# Patient Record
Sex: Male | Born: 1995 | Race: Black or African American | Hispanic: No | Marital: Single | Smoking: Current every day smoker
Health system: Southern US, Community
[De-identification: ages and names within clinical notes are randomized; demographics above are authoritative.]

---

## 2016-09-01 ENCOUNTER — Encounter: Payer: Self-pay | Admitting: *Deleted

## 2016-09-01 ENCOUNTER — Ambulatory Visit
Admission: EM | Admit: 2016-09-01 | Discharge: 2016-09-01 | Disposition: A | Discharge status: Home / Self Care | Payer: Self-pay | Attending: Family Medicine | Admitting: Family Medicine

## 2016-09-01 DIAGNOSIS — L02214 Cutaneous abscess of groin: Secondary | ICD-10-CM

## 2016-09-01 MED ORDER — SULFAMETHOXAZOLE-TRIMETHOPRIM 800-160 MG PO TABS: 1.0000 | ORAL_TABLET | Freq: Two times a day (BID) | ORAL | 0 refills | Status: AC

## 2016-09-01 MED ORDER — MUPIROCIN 2 % EX OINT: TOPICAL_OINTMENT | CUTANEOUS | 0 refills | Status: DC

## 2016-09-01 NOTE — ED Triage Notes (Signed)
Abcess to right groin, x1 week. Now draining.

## 2016-09-01 NOTE — Discharge Instructions (Signed)
Take medication as prescribed. Rest. Drink plenty of fluids. Keep area clean. Apply warm compresses as discussed.   Follow up with your primary care physician this week as needed. Return to Urgent care for new or worsening concerns.

## 2016-09-01 NOTE — ED Provider Notes (Signed)
[facility] ____________________________________________  Time seen: Approximately 11:38 AM  I have reviewed the triage vital signs and the nursing notes.   HISTORY  Chief Complaint Abscess  HPI  Hunter Wong is a 21 y.o. male pain for evaluation of abscess to right groin. Patient reports that this is been a gradual onset over the last week and started draining over the last 2 days. Patient reports drinking increased today. Reports intermittent pain to the same area and states mostly at night when he is trying to changes positions causes aggravation. Patient states mild pain to the same area at this time. Denies history of similar in past. Reports that his girlfriend does have a history of some abscesses, denies known history of MRSA. Denies any other skin changes, rash or swelling. Denies any penile or testicular pain, swelling or discharge. Patient reports that the area started as a small bump or ingrown hair and then progressed. Patient denies fevers. Reports continues to eat and drink well.   Denies chest pain, shortness of breath, abdominal pain, dysuria, extremity pain, extremity swelling or rash. Denies recent sickness. Denies recent antibiotic use.    History reviewed. No pertinent past medical history. Denies chronic medical issues.  There are no active problems to display for this patient.   History reviewed. No pertinent surgical history.   No current facility-administered medications for this encounter.   Current Outpatient Prescriptions:  .  mupirocin ointment (BACTROBAN) 2 %, Apply three times a day for 7 days., Disp: 22 g, Rfl: 0 .  sulfamethoxazole-trimethoprim (BACTRIM DS,SEPTRA DS) 800-160 MG tablet, Take 1 tablet by mouth 2 (two) times daily., Disp: 20 tablet, Rfl: 0  Allergies Patient has no known allergies.  family history Denies family history of MRSA or abscesses.   Social History Social History  Substance Use Topics  . Smoking status:  Current Every Day Smoker  . Smokeless tobacco: Never Used  . Alcohol use No    Review of Systems Constitutional: No fever/chills Cardiovascular: Denies chest pain. Respiratory: Denies shortness of breath. Gastrointestinal: No abdominal pain.  No nausea, no vomiting.  No diarrhea.  Genitourinary: Negative for dysuria. Musculoskeletal: Negative for back pain. Skin: As above.   ____________________________________________   PHYSICAL EXAM:  VITAL SIGNS: ED Triage Vitals  Enc Vitals Group     BP 09/01/16 1007 124/80     Pulse Rate 09/01/16 1007 (!) 55     Resp 09/01/16 1007 16     Temp 09/01/16 1007 98.7 F (37.1 C)     Temp Source 09/01/16 1007 Oral     SpO2 09/01/16 1007 100 %     Weight 09/01/16 1009 195 lb (88.5 kg)     Height 09/01/16 1009  (1.753 m)     Head Circumference --      Peak Flow --      Pain Score 09/01/16 1105 7     Pain Loc --      Pain Edu? --      Excl. in GC? --     Constitutional: Alert and oriented. Well appearing and in no acute distress. ENT      Head: Normocephalic and atraumatic. Cardiovascular: Normal rate, regular rhythm. Grossly normal heart sounds.  Good peripheral circulation. Respiratory: Normal respiratory effort without tachypnea nor retractions. Breath sounds are clear and equal bilaterally. No wheezes, rales, rhonchi. Gastrointestinal: Soft and nontender. No distention.  Musculoskeletal:  Ambulatory with steady gait.  Neurologic:  Normal speech and language. Speech is normal. No gait  instability.  Skin:  Skin is warm, dry.  Except: Exam completed with Selena Batten RN at bedside as chaperone: Approximately 2 x 2 cm area of erythema induration present to right upper inguinal crease and mild fluctuance with active purulent drainage, no further surrounding induration or erythema, mild tenderness to direct palpation, no surrounding tenderness, bilateral lower extremities and full range of motion. Patient ambulatory in room. Psychiatric: Mood  and affect are normal. Speech and behavior are normal. Patient exhibits appropriate insight and judgment.   ___________________________________________   LABS (all labs ordered are listed, but only abnormal results are displayed)  Labs Reviewed  AEROBIC/ANAEROBIC CULTURE (SURGICAL/DEEP WOUND)     PROCEDURES Procedures    INITIAL IMPRESSION / ASSESSMENT AND PLAN / ED COURSE  Pertinent labs & imaging results that were available during my care of the patient were reviewed by me and considered in my medical decision making (see chart for details).  Well-appearing patient. No acute distress. Right inguinal crease abscess actively draining. Encouraged patient to keep clean, warm compresses and will treat with oral Bactrim and topical Bactroban. Wound culture obtained. Encouraged supportive care. Discussed strict follow-up and return parameters. Discussed indication, risks and benefits of medications with patient.  Discussed follow up with Primary care physician this week. Discussed follow up and return parameters including no resolution or any worsening concerns. Patient verbalized understanding and agreed to plan.   ____________________________________________   FINAL CLINICAL IMPRESSION(S) / ED DIAGNOSES  Final diagnoses:  Abscess of right groin     Discharge Medication List as of 09/01/2016 11:03 AM    START taking these medications   Details  mupirocin ointment (BACTROBAN) 2 % Apply three times a day for 7 days., Normal    sulfamethoxazole-trimethoprim (BACTRIM DS,SEPTRA DS) 800-160 MG tablet Take 1 tablet by mouth 2 (two) times daily., Starting Wed 09/01/2016, Until Sat 09/11/2016, Normal        Note: This dictation was prepared with Dragon dictation along with smaller phrase technology. Any transcriptional errors that result from this process are unintentional.         Renford Dills, NP 09/01/16 1405

## 2016-09-06 LAB — AEROBIC/ANAEROBIC CULTURE (SURGICAL/DEEP WOUND)

## 2016-09-06 LAB — AEROBIC/ANAEROBIC CULTURE W GRAM STAIN (SURGICAL/DEEP WOUND): Special Requests: NORMAL

## 2019-08-30 ENCOUNTER — Ambulatory Visit
Admission: EM | Admit: 2019-08-30 | Discharge: 2019-08-30 | Disposition: A | Discharge status: Home / Self Care | Payer: Self-pay | Attending: Family Medicine | Admitting: Family Medicine

## 2019-08-30 ENCOUNTER — Encounter: Payer: Self-pay | Admitting: Emergency Medicine

## 2019-08-30 ENCOUNTER — Other Ambulatory Visit: Payer: Self-pay

## 2019-08-30 DIAGNOSIS — L0291 Cutaneous abscess, unspecified: Secondary | ICD-10-CM

## 2019-08-30 MED ORDER — KETOROLAC TROMETHAMINE 10 MG PO TABS: 10.0000 mg | ORAL_TABLET | Freq: Four times a day (QID) | ORAL | 0 refills | Status: DC | PRN

## 2019-08-30 MED ORDER — DOXYCYCLINE HYCLATE 100 MG PO CAPS: 100.0000 mg | ORAL_CAPSULE | Freq: Two times a day (BID) | ORAL | 0 refills | Status: DC

## 2019-08-30 NOTE — ED Triage Notes (Signed)
Patient c/o abscess to left lower back x 3-4 days. He states the area is very painful to touch.

## 2019-08-30 NOTE — Discharge Instructions (Signed)
Return in 2 days for packing removal and reassessment.  Antibiotic twice daily with food.  Take care  Dr. Adriana Simas
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
## 2019-09-01 ENCOUNTER — Other Ambulatory Visit: Payer: Self-pay

## 2019-09-01 ENCOUNTER — Ambulatory Visit
Admission: EM | Admit: 2019-09-01 | Discharge: 2019-09-01 | Disposition: A | Discharge status: Home / Self Care | Payer: Medicaid Other

## 2019-09-01 DIAGNOSIS — L0231 Cutaneous abscess of buttock: Secondary | ICD-10-CM

## 2019-09-01 DIAGNOSIS — L03317 Cellulitis of buttock: Secondary | ICD-10-CM

## 2019-09-01 NOTE — Discharge Instructions (Addendum)
Please change bandage daily.  You may shower and get wet.  Follow-up in 2 to 3 days for recheck and packing removal.  Please use coupon to purchase antibiotics.  Return to the clinic for any increasing pain swelling fevers

## 2019-09-01 NOTE — ED Provider Notes (Signed)
[facility]    CSN: [id number] Arrival date & time: 09/01/19  0808      History   Chief Complaint No chief complaint on file.   HPI Hunter Wong is a 24 y.o. male presents to the urgent care facility for evaluation of left buttocks abscess.  Abscess developed 5 to 6 days ago.  2 days ago received I&D and packing and was placed on antibiotics.  He did not purchase antibiotics.  He comes in today for recheck.  Still having some purulent drainage.  No fevers.  Pain is moderate.  Swelling overall has improved.  HPI  History reviewed. No pertinent past medical history.  There are no problems to display for this patient.   History reviewed. No pertinent surgical history.     Home Medications    Prior to Admission medications   Medication Sig Start Date End Date Taking? Authorizing Provider  doxycycline (VIBRAMYCIN) 100 MG capsule Take 1 capsule (100 mg total) by mouth 2 (two) times daily. Patient not taking: Reported on 09/01/2019 08/30/19   Tommie Sams, DO  ketorolac (TORADOL) 10 MG tablet Take 1 tablet (10 mg total) by mouth every 6 (six) hours as needed for moderate pain or severe pain. Patient not taking: Reported on 09/01/2019 08/30/19   Tommie Sams, DO    Family History History reviewed. No pertinent family history.  Social History Social History   Tobacco Use  . Smoking status: Former Games developer  . Smokeless tobacco: Never Used  Substance Use Topics  . Alcohol use: No  . Drug use: Never     Allergies   Patient has no known allergies.   Review of Systems Review of Systems  Constitutional: Negative for fatigue and fever.  Cardiovascular: Negative for leg swelling.  Musculoskeletal: Negative for arthralgias and back pain.  Skin: Positive for wound.  Neurological: Negative for numbness.     Physical Exam Triage Vital Signs ED Triage Vitals  Enc Vitals Group     BP 09/01/19 0823 (!) 140/92     Pulse Rate 09/01/19 0823 75     Resp 09/01/19  0823 18     Temp 09/01/19 0823 98 F (36.7 C)     Temp Source 09/01/19 0823 Oral     SpO2 09/01/19 0823 100 %     Weight 09/01/19 0821 167 lb (75.8 kg)     Height 09/01/19 0821 5\' 9"  (1.753 m)     Head Circumference --      Peak Flow --      Pain Score 09/01/19 0821 10     Pain Loc --      Pain Edu? --      Excl. in GC? --    No data found.  Updated Vital Signs BP (!) 140/92 (BP Location: Left Arm)   Pulse 75   Temp 98 F (36.7 C) (Oral)   Resp 18   Ht 5\' 9"  (1.753 m)   Wt 167 lb (75.8 kg)   SpO2 100%   BMI 24.66 kg/m   Visual Acuity Right Eye Distance:   Left Eye Distance:   Bilateral Distance:    Right Eye Near:   Left Eye Near:    Bilateral Near:     Physical Exam Constitutional:      Appearance: He is well-developed.  HENT:     Head: Normocephalic and atraumatic.  Eyes:     Conjunctiva/sclera: Conjunctivae normal.  Cardiovascular:     Rate and Rhythm: Normal rate.  Pulmonary:     Effort: Pulmonary effort is normal. No respiratory distress.  Musculoskeletal:        General: Normal range of motion.     Cervical back: Normal range of motion.  Skin:    General: Skin is warm.     Findings: No rash.     Comments: Left superior buttocks, superior small abscess present, 2 cm diameter.  Slight fluctuance.  Iodoform packing is not present, slight fluctuance noted, purulent material expressed with compression.  Abscess cleansed and repacked with iodoform packing.  No active bleeding.  Neurological:     Mental Status: He is alert and oriented to person, place, and time.  Psychiatric:        Behavior: Behavior normal.        Thought Content: Thought content normal.      UC Treatments / Results  Labs (all labs ordered are listed, but only abnormal results are displayed) Labs Reviewed - No data to display  EKG   Radiology No results found.  Procedures Procedures (including critical care time)  Medications Ordered in UC Medications - No data to  display  Initial Impression / Assessment and Plan / UC Course  I have reviewed the triage vital signs and the nursing notes.  Pertinent labs & imaging results that were available during my care of the patient were reviewed by me and considered in my medical decision making (see chart for details).     24 year old male with left buttocks abscess.  Iodoform packing reapplied, he will practice antibiotics.  He is given a coupon today.  He is agreeable to purchasing antibiotics.  Follow-up 2 to 3 days for recheck and packing removal.  He is educated on wound care.  He understands signs symptoms return to the urgent care for. Final Clinical Impressions(s) / UC Diagnoses   Final diagnoses:  Cellulitis and abscess of buttock     Discharge Instructions     Please change bandage daily.  You may shower and get wet.  Follow-up in 2 to 3 days for recheck and packing removal.  Please use coupon to purchase antibiotics.  Return to the clinic for any increasing pain swelling fevers   ED Prescriptions    None     PDMP not reviewed this encounter.   Duanne Guess, PA-C 09/01/19 0900

## 2019-09-01 NOTE — ED Triage Notes (Addendum)
Pt presents with c/o continued pain to area of abscess on left buttock. Pt states the pain is still present. Pt has difficulty sitting and sleeping. Pt states area is still seeping. Pt DID NOT take abx or pain meds rx'd at [facility]. Pt does not have insurance.

## 2020-05-10 ENCOUNTER — Emergency Department
Admission: EM | Admit: 2020-05-10 | Discharge: 2020-05-10 | Disposition: A | Discharge status: Home / Self Care | Payer: Medicaid Other | Attending: Emergency Medicine | Admitting: Emergency Medicine

## 2020-05-10 ENCOUNTER — Other Ambulatory Visit: Payer: Self-pay

## 2020-05-10 DIAGNOSIS — L0201 Cutaneous abscess of face: Secondary | ICD-10-CM | POA: Insufficient documentation

## 2020-05-10 DIAGNOSIS — Z5321 Procedure and treatment not carried out due to patient leaving prior to being seen by health care provider: Secondary | ICD-10-CM | POA: Insufficient documentation

## 2020-05-10 NOTE — ED Notes (Signed)
Calling for pt in lobby, pt not in lobby.

## 2020-05-10 NOTE — ED Notes (Signed)
Called pt a third time without response.

## 2020-05-10 NOTE — ED Notes (Signed)
Pt not visualized in lobby. Called pt again without answer.

## 2020-05-10 NOTE — ED Triage Notes (Signed)
Pt with large golf ball sized abscess noted to right lower chin. Pt states was seen at [facility] for same two days ago, and was prescribed antibiotics but did not get them filled.

## 2020-12-03 ENCOUNTER — Encounter: Payer: Self-pay | Admitting: *Deleted

## 2020-12-03 ENCOUNTER — Other Ambulatory Visit: Payer: Self-pay

## 2020-12-03 ENCOUNTER — Emergency Department
Admission: EM | Admit: 2020-12-03 | Discharge: 2020-12-04 | Disposition: A | Discharge status: Home / Self Care | Payer: Self-pay | Attending: Emergency Medicine | Admitting: Emergency Medicine

## 2020-12-03 DIAGNOSIS — J019 Acute sinusitis, unspecified: Secondary | ICD-10-CM

## 2020-12-03 DIAGNOSIS — Z87891 Personal history of nicotine dependence: Secondary | ICD-10-CM | POA: Insufficient documentation

## 2020-12-03 DIAGNOSIS — H5712 Ocular pain, left eye: Secondary | ICD-10-CM | POA: Insufficient documentation

## 2020-12-03 DIAGNOSIS — R519 Headache, unspecified: Secondary | ICD-10-CM | POA: Insufficient documentation

## 2020-12-03 MED ORDER — FLUORESCEIN SODIUM 1 MG OP STRP
1.0000 | ORAL_STRIP | Freq: Once | OPHTHALMIC | Status: AC
  Administered 2020-12-04: 1 via OPHTHALMIC
  Filled 2020-12-03: qty 1

## 2020-12-03 MED ORDER — TETRACAINE HCL 0.5 % OP SOLN
2.0000 [drp] | Freq: Once | OPHTHALMIC | Status: AC
  Administered 2020-12-04: 2 [drp] via OPHTHALMIC
  Filled 2020-12-03: qty 4

## 2020-12-03 NOTE — ED Triage Notes (Signed)
Pt reports 2-3 days of headache and left eye redness/irritation and watering. Denies n/v. Taking tylenol for pain relief, last took about 1 hour ago.

## 2020-12-03 NOTE — ED Notes (Signed)
Pt reports 10/10 left-sided headache x 3 days with no cough, fever, body aches, nausea, vomiting, runny nose. He does have watery discharge from the left eye for the same 3 day period; he says there is a throbbing pain in the eye rated 10/10 as well. He has taken tylenol with no relief of symptoms. Last dose (1000mg ) was around 9pm tonight.

## 2020-12-04 ENCOUNTER — Emergency Department: Payer: Self-pay

## 2020-12-04 IMAGING — MR MR MRA HEAD WO/W CM
11 of 12 series · 46 of 48 positions shown · IV contrast (7.5ml Gadavist)
Comparison: None.
COMPARISON: None.

Addendum:
CLINICAL DATA: Headache.  Papilledema.

EXAM:
MRI HEAD WITHOUT CONTRAST
MRA HEAD WITHOUT AND WITH CONTRAST
MRV HEAD WITHOUT AND WITH CONTRAST
TECHNIQUE: Multiplanar, multi-echo pulse sequences of the brain and surrounding
structures were acquired without intravenous contrast. Angiographic
images of the Circle of Willis were acquired using MRA technique
without and with intravenous contrast. Angiographic images of the
intracranial venous structures were acquired using MRV technique
without and with intravenous contrast.
CONTRAST:  7.5mL GADAVIST GADOBUTROL 1 MMOL/ML IV SOLN

[Series 5: ax dwi_tracew · axial · 3.0mm · 0.65mm/px · z∈[-100,+61]mm · 5 of 100 slices shown]
[im 1/100]
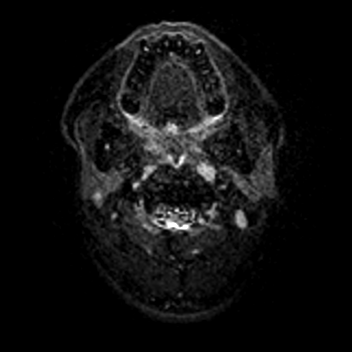
[im 25/100]
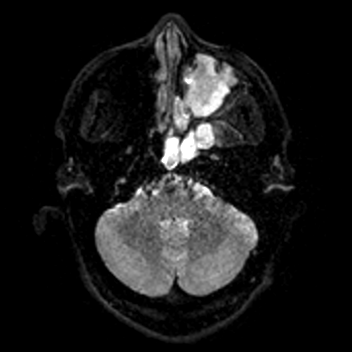
[im 50/100]
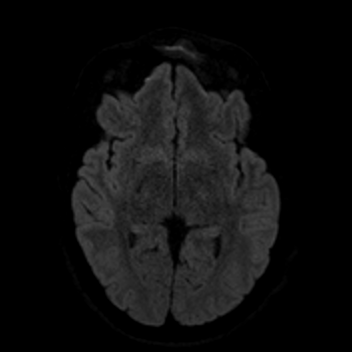
[im 75/100]
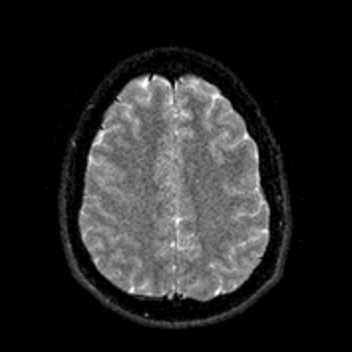
[im 100/100]
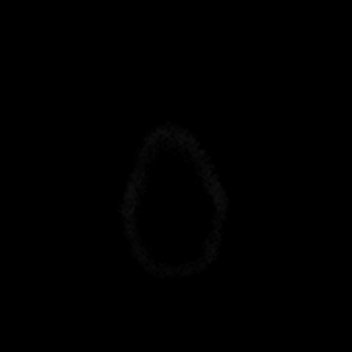

[Series 15: T2 · axial · 5.0mm · 0.53mm/px · 1 of 28 slices shown (1 of 2)]
[im 1/28]
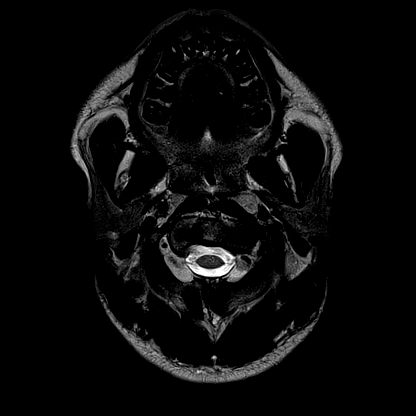

[Series 16: mag_images · axial · 3.0mm · 0.90mm/px · z∈[-109,+67]mm · 3 of 60 slices shown]
[im 1/60]
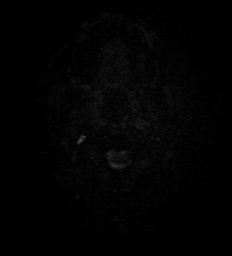
[im 30/60]
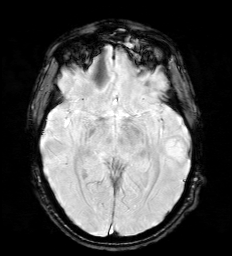
[im 60/60]
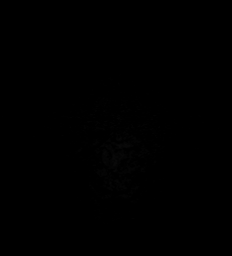

[Series 18: swi_images · axial · 3.0mm · 0.90mm/px · z∈[-109,+67]mm · 3 of 60 slices shown]
[im 1/60]
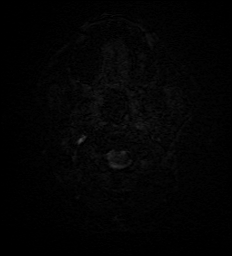
[im 30/60]
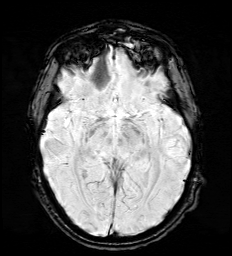
[im 60/60]
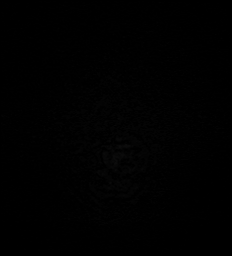

[Series 19: mip_images(sw) · axial · 24.0mm · 0.90mm/px · z∈[-98,+57]mm · 2 of 53 slices shown]
[im 1/53]
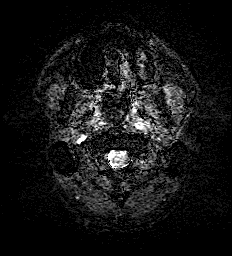
[im 53/53]
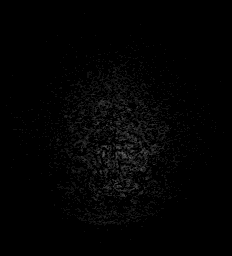

[Series 20: FLAIR · axial · 3.0mm · 0.53mm/px · z∈[-101,+60]mm · 2 of 55 slices shown]
[im 1/55]
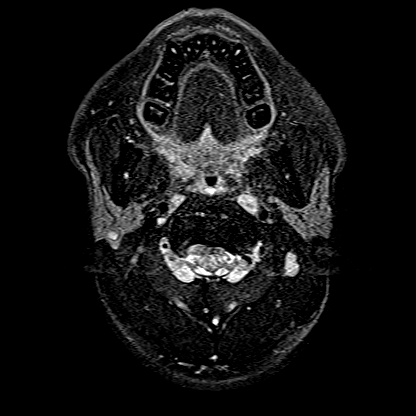
[im 55/55]
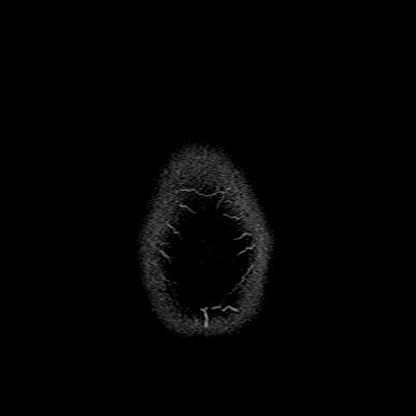

[Series 21: T1 · axial · 1.0mm · 0.98mm/px · z∈[-114,+75]mm · 8 of 192 slices shown]
[im 1/192]
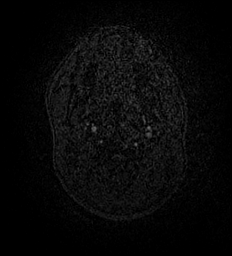
[im 28/192]
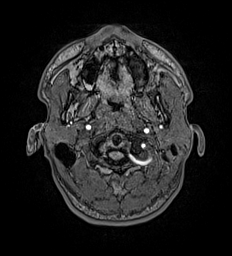
[im 55/192]
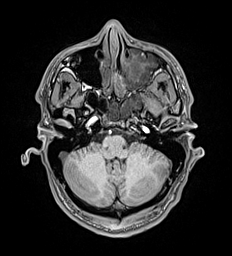
[im 82/192]
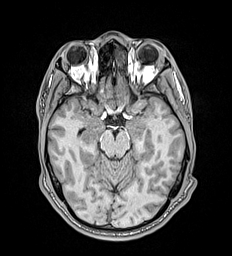
[im 110/192]
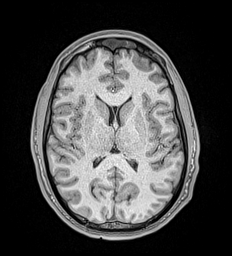
[im 137/192]
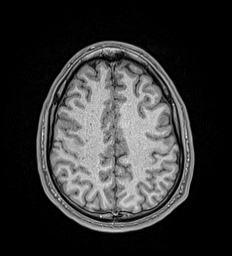
[im 164/192]
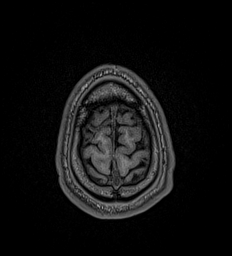
[im 192/192]
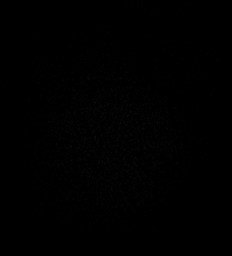

[Series 22: T2 · coronal · 5.0mm · 0.57mm/px · 1 of 35 slices shown (2 of 2)]
[im 1/35]
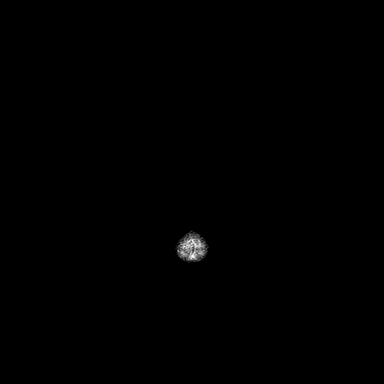

[Series 34: TOF · coronal · 2.5mm · 0.98mm/px · 5 of 128 slices shown]
[im 1/128]
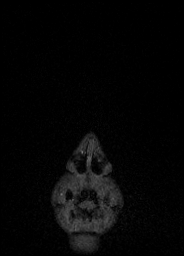
[im 32/128]
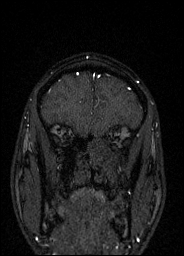
[im 64/128]
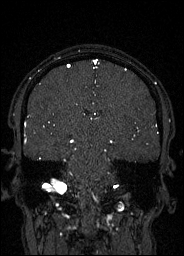
[im 96/128]
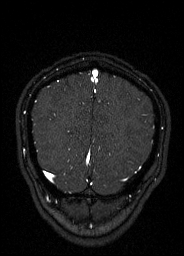
[im 128/128]
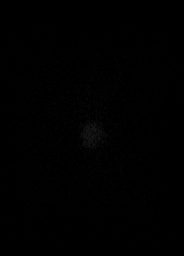

[Series 38: T1 post-contrast · sagittal · 1.0mm · 0.98mm/px · 8 of 191 slices shown (1 of 2)]
[im 1/191]
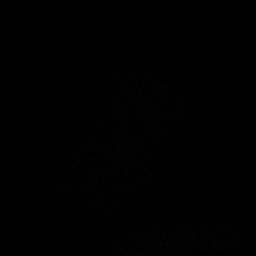
[im 28/191]
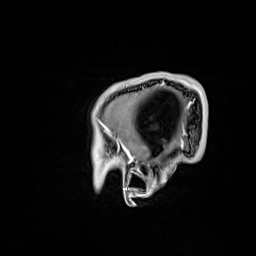
[im 55/191]
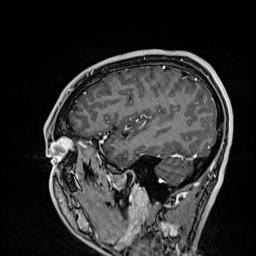
[im 82/191]
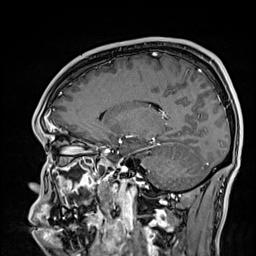
[im 109/191]
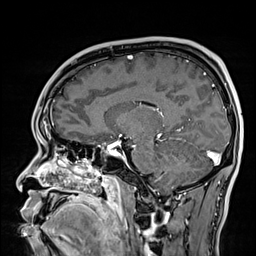
[im 136/191]
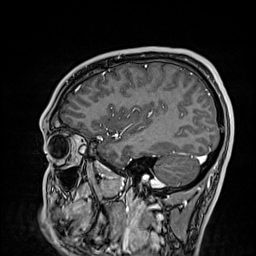
[im 163/191]
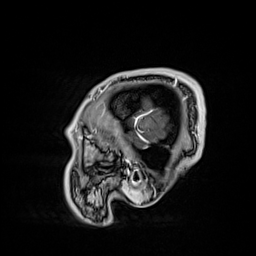
[im 191/191]
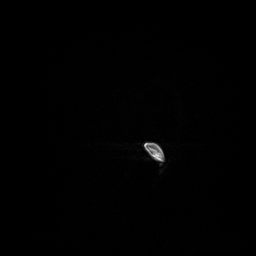

[Series 39: T1 post-contrast · sagittal · 1.0mm · 0.98mm/px · 8 of 192 slices shown (2 of 2)]
[im 1/192]
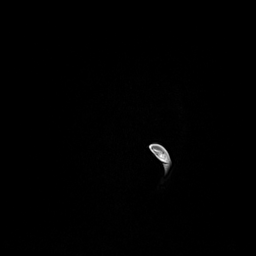
[im 28/192]
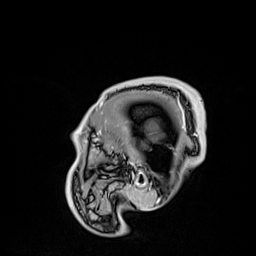
[im 55/192]
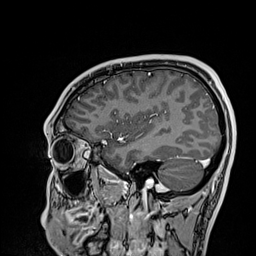
[im 82/192]
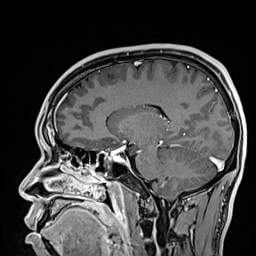
[im 110/192]
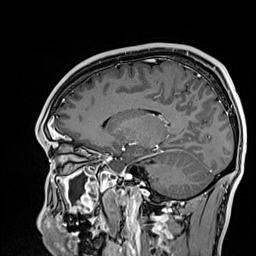
[im 137/192]
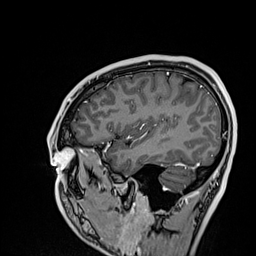
[im 164/192]
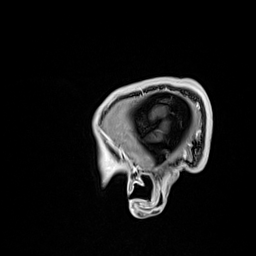
[im 192/192]
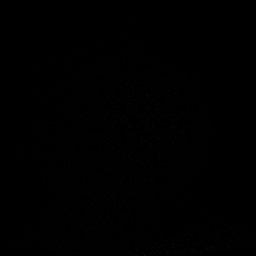

[46 of 48 positions shown; findings below may reference images not displayed]

FINDINGS: MRI HEAD FINDINGS

Brain: No acute infarction, hemorrhage, hydrocephalus, extra-axial
collection or mass lesion. A single punctate T2/FLAIR hyperintensity
is noted within the right frontal white matter, which is nonspecific
but within normal limits for patient age.

Vascular: See below.

Skull and upper cervical spine: Normal marrow signal.

Sinuses/Orbits: Complete opacification of the left frontal sinus,
left ethmoid air cells, left maxillary sinus, and left sphenoid
sinus with marked mucosal thickening in these areas. Mild mucosal
thickening of the right ethmoid air cells and right sphenoid sinus.
Areas of internal T2 hypointensity within the left ethmoid air cells
and the left maxillary sinus.

Other: Enlarged bilateral lateral retropharyngeal nodes.

MRA HEAD FINDINGS

Anterior circulation: Bilateral ICAs, MCAs, and ACAs are patent
without proximal flow limiting stenosis. No aneurysm identified.

Posterior circulation: Small right intradural vertebral artery, most
likely non dominant. Bilateral intradural vertebral arteries,
basilar artery, and posterior cerebral arteries are patent without
proximal flow limiting stenosis. Prominent bilateral posterior
communicating arteries, anatomic variant. No aneurysm identified.

Anatomic variants: As detailed above.

MRV HEAD FINDINGS

No evidence of dural venous sinus thrombosis. The left transverse
sinus is hypoplastic. The visualized deep cerebral veins are patent.
Cavernous sinus enhancement appears symmetric.
IMPRESSION: MRI:

1. No evidence of acute intracranial abnormality.
2. Severe left ostiomeatal lesion pattern sinusitis, as described
above. Areas of internal T2 hypointensity could represent
inspissated secretions and/or fungal colonization.
3. Enlarged bilateral lateral retropharyngeal nodes, nonspecific but
potentially reactive.

MRA:

No large vessel occlusion or proximal hemodynamically significant
stenosis.

MRV:

No evidence of dural venous sinus thrombosis.

ADDENDUM:
Correction to the impression: There is severe left ostiomeatal UNIT
pattern sinusitis, not left ostiomeatal lesion pattern.

Findings in the report and this addendum discussed with Dr.
EM BILAL Via telephone at [DATE].

*** End of Addendum ***
FINDINGS: MRI HEAD FINDINGS

Brain: No acute infarction, hemorrhage, hydrocephalus, extra-axial
collection or mass lesion. A single punctate T2/FLAIR hyperintensity
is noted within the right frontal white matter, which is nonspecific
but within normal limits for patient age.

Vascular: See below.

Skull and upper cervical spine: Normal marrow signal.

Sinuses/Orbits: Complete opacification of the left frontal sinus,
left ethmoid air cells, left maxillary sinus, and left sphenoid
sinus with marked mucosal thickening in these areas. Mild mucosal
thickening of the right ethmoid air cells and right sphenoid sinus.
Areas of internal T2 hypointensity within the left ethmoid air cells
and the left maxillary sinus.

Other: Enlarged bilateral lateral retropharyngeal nodes.

MRA HEAD FINDINGS

Anterior circulation: Bilateral ICAs, MCAs, and ACAs are patent
without proximal flow limiting stenosis. No aneurysm identified.

Posterior circulation: Small right intradural vertebral artery, most
likely non dominant. Bilateral intradural vertebral arteries,
basilar artery, and posterior cerebral arteries are patent without
proximal flow limiting stenosis. Prominent bilateral posterior
communicating arteries, anatomic variant. No aneurysm identified.

Anatomic variants: As detailed above.

MRV HEAD FINDINGS

No evidence of dural venous sinus thrombosis. The left transverse
sinus is hypoplastic. The visualized deep cerebral veins are patent.
Cavernous sinus enhancement appears symmetric.
IMPRESSION: MRI:

1. No evidence of acute intracranial abnormality.
2. Severe left ostiomeatal lesion pattern sinusitis, as described
above. Areas of internal T2 hypointensity could represent
inspissated secretions and/or fungal colonization.
3. Enlarged bilateral lateral retropharyngeal nodes, nonspecific but
potentially reactive.

MRA:

No large vessel occlusion or proximal hemodynamically significant
stenosis.

MRV:

No evidence of dural venous sinus thrombosis.

## 2020-12-04 IMAGING — MR MR MRV HEAD WO/W CM
14 of 17 series · 36 of 48 positions shown · IV contrast (gadavist)
Comparison: None.
COMPARISON: None.

Addendum:
CLINICAL DATA: Headache.  Papilledema.

EXAM:
MRI HEAD WITHOUT CONTRAST
MRA HEAD WITHOUT AND WITH CONTRAST
MRV HEAD WITHOUT AND WITH CONTRAST
TECHNIQUE: Multiplanar, multi-echo pulse sequences of the brain and surrounding
structures were acquired without intravenous contrast. Angiographic
images of the Circle of Willis were acquired using MRA technique
without and with intravenous contrast. Angiographic images of the
intracranial venous structures were acquired using MRV technique
without and with intravenous contrast.
CONTRAST:  7.5mL GADAVIST GADOBUTROL 1 MMOL/ML IV SOLN

[Series 5: ax dwi_tracew · axial · 3.0mm · 0.65mm/px · z∈[-100,+61]mm · 2 of 50 slices shown]
[im 1/50]
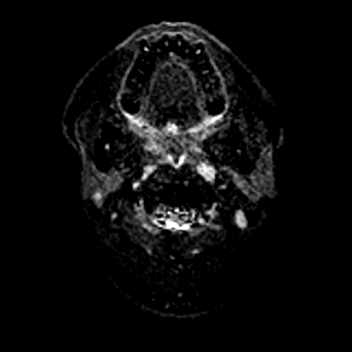
[im 50/50]
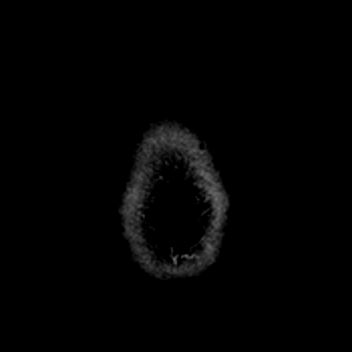

[Series 6: ax dwi_adc · axial · 3.0mm · 0.65mm/px · z∈[-100,+61]mm · 2 of 50 slices shown]
[im 1/50]
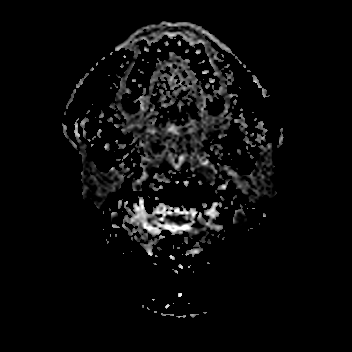
[im 50/50]
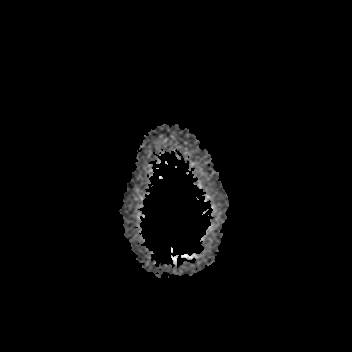

[Series 7: cor dwi_tracew · coronal · 5.0mm · 0.65mm/px · 2 of 40 slices shown]
[im 1/40]
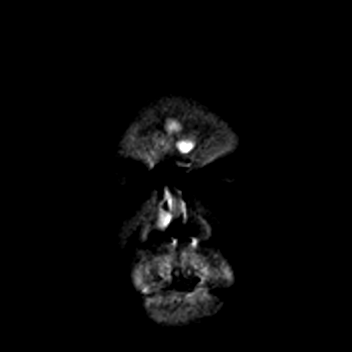
[im 40/40]
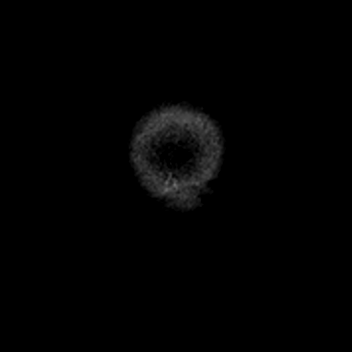

[Series 8: cor dwi_adc · coronal · 5.0mm · 0.65mm/px · 2 of 40 slices shown]
[im 1/40]
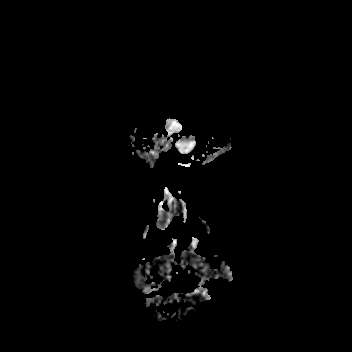
[im 40/40]
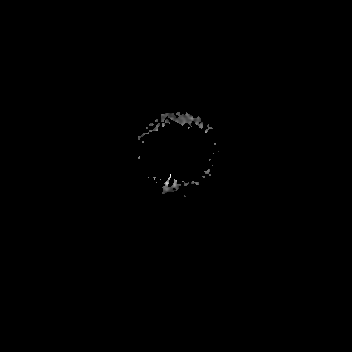

[Series 14: T1 · sagittal · 5.0mm · 0.62mm/px · 1 of 25 slices shown (1 of 2)]
[im 1/25]
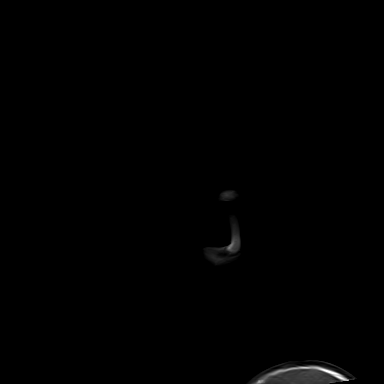

[Series 15: T2 · axial · 5.0mm · 0.53mm/px · 1 of 28 slices shown (1 of 2)]
[im 1/28]
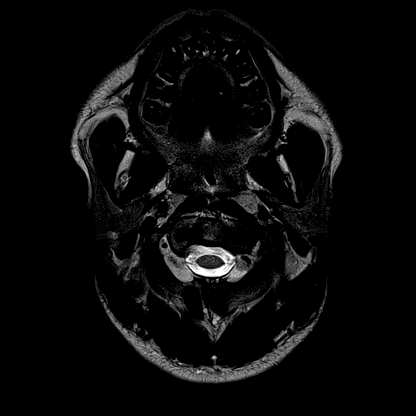

[Series 16: mag_images · axial · 3.0mm · 0.90mm/px · z∈[-109,+67]mm · 2 of 60 slices shown]
[im 1/60]
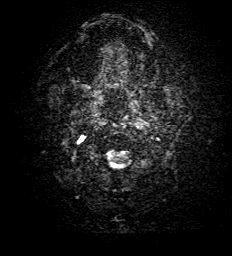
[im 60/60]
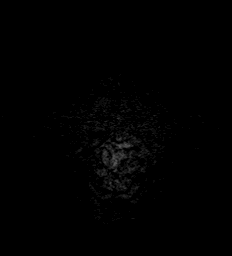

[Series 18: swi_images · axial · 3.0mm · 0.90mm/px · z∈[-109,+67]mm · 2 of 60 slices shown]
[im 1/60]
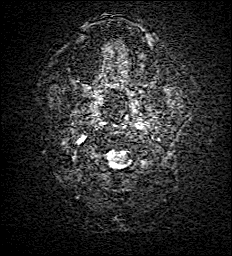
[im 60/60]
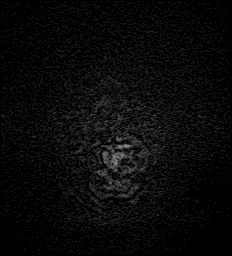

[Series 19: mip_images(sw) · axial · 24.0mm · 0.90mm/px · z∈[-98,+57]mm · 2 of 53 slices shown]
[im 1/53]
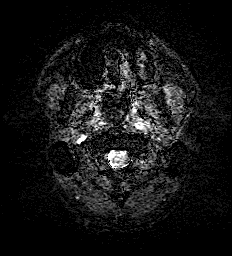
[im 53/53]
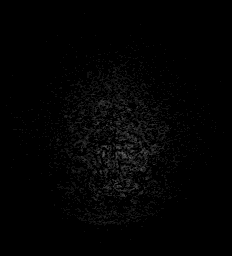

[Series 20: FLAIR · axial · 3.0mm · 0.53mm/px · z∈[-101,+60]mm · 2 of 55 slices shown]
[im 1/55]
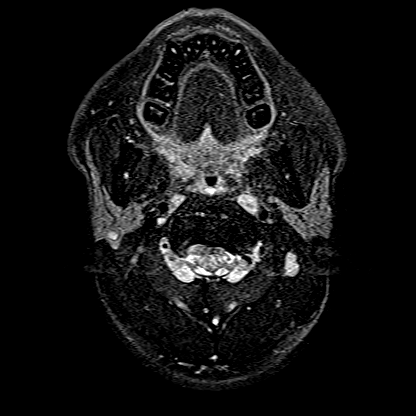
[im 55/55]
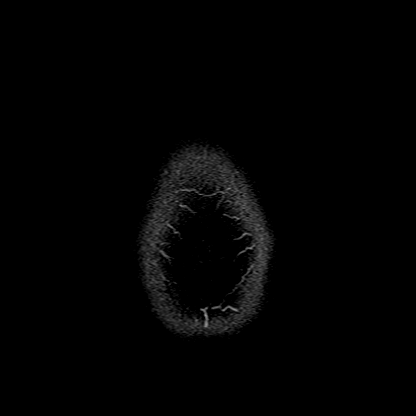

[Series 21: T1 · axial · 1.0mm · 0.98mm/px · z∈[-114,+75]mm · 7 of 192 slices shown (2 of 2)]
[im 1/192]
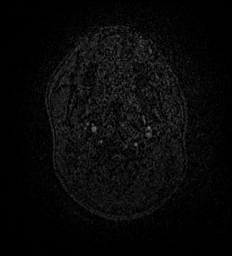
[im 32/192]
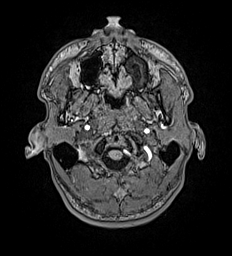
[im 64/192]
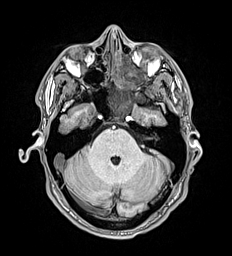
[im 96/192]
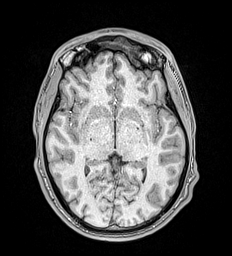
[im 128/192]
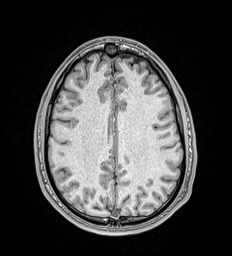
[im 160/192]
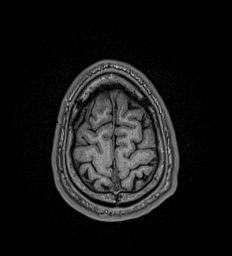
[im 192/192]
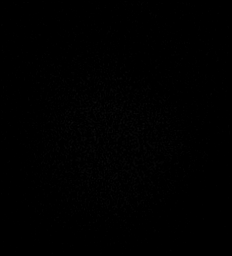

[Series 22: T2 · coronal · 5.0mm · 0.57mm/px · 1 of 35 slices shown (2 of 2)]
[im 1/35]
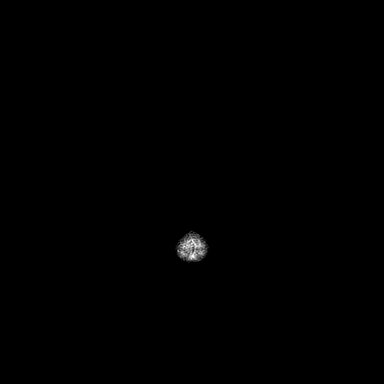

[Series 27: TOF · coronal · 2.5mm · 0.98mm/px · 5 of 128 slices shown (1 of 2)]
[im 1/128]
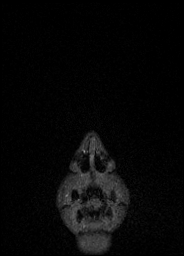
[im 32/128]
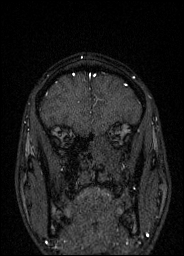
[im 64/128]
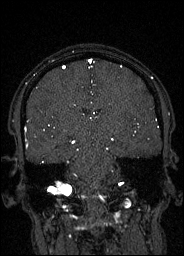
[im 96/128]
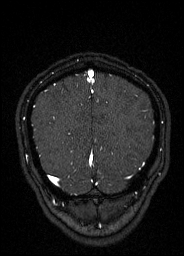
[im 128/128]
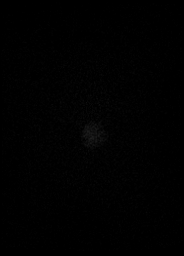

[Series 34: TOF · coronal · 2.5mm · 0.98mm/px · 5 of 128 slices shown (2 of 2)]
[im 1/128]
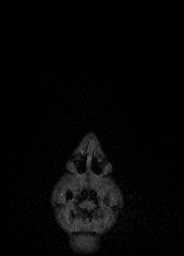
[im 32/128]
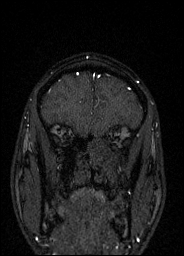
[im 64/128]
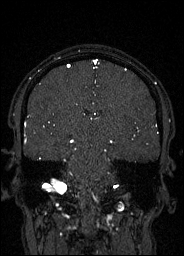
[im 96/128]
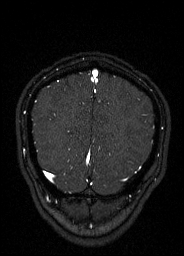
[im 128/128]
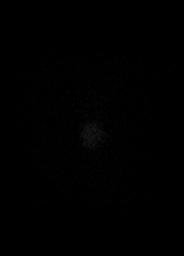

[36 of 48 positions shown; findings below may reference images not displayed]

FINDINGS: MRI HEAD FINDINGS

Brain: No acute infarction, hemorrhage, hydrocephalus, extra-axial
collection or mass lesion. A single punctate T2/FLAIR hyperintensity
is noted within the right frontal white matter, which is nonspecific
but within normal limits for patient age.

Vascular: See below.

Skull and upper cervical spine: Normal marrow signal.

Sinuses/Orbits: Complete opacification of the left frontal sinus,
left ethmoid air cells, left maxillary sinus, and left sphenoid
sinus with marked mucosal thickening in these areas. Mild mucosal
thickening of the right ethmoid air cells and right sphenoid sinus.
Areas of internal T2 hypointensity within the left ethmoid air cells
and the left maxillary sinus.

Other: Enlarged bilateral lateral retropharyngeal nodes.

MRA HEAD FINDINGS

Anterior circulation: Bilateral ICAs, MCAs, and ACAs are patent
without proximal flow limiting stenosis. No aneurysm identified.

Posterior circulation: Small right intradural vertebral artery, most
likely non dominant. Bilateral intradural vertebral arteries,
basilar artery, and posterior cerebral arteries are patent without
proximal flow limiting stenosis. Prominent bilateral posterior
communicating arteries, anatomic variant. No aneurysm identified.

Anatomic variants: As detailed above.

MRV HEAD FINDINGS

No evidence of dural venous sinus thrombosis. The left transverse
sinus is hypoplastic. The visualized deep cerebral veins are patent.
Cavernous sinus enhancement appears symmetric.
IMPRESSION: MRI:

1. No evidence of acute intracranial abnormality.
2. Severe left ostiomeatal lesion pattern sinusitis, as described
above. Areas of internal T2 hypointensity could represent
inspissated secretions and/or fungal colonization.
3. Enlarged bilateral lateral retropharyngeal nodes, nonspecific but
potentially reactive.

MRA:

No large vessel occlusion or proximal hemodynamically significant
stenosis.

MRV:

No evidence of dural venous sinus thrombosis.

ADDENDUM:
Correction to the impression: There is severe left ostiomeatal UNIT
pattern sinusitis, not left ostiomeatal lesion pattern.

Findings in the report and this addendum discussed with Dr.
EM BILAL Via telephone at [DATE].

*** End of Addendum ***
FINDINGS: MRI HEAD FINDINGS

Brain: No acute infarction, hemorrhage, hydrocephalus, extra-axial
collection or mass lesion. A single punctate T2/FLAIR hyperintensity
is noted within the right frontal white matter, which is nonspecific
but within normal limits for patient age.

Vascular: See below.

Skull and upper cervical spine: Normal marrow signal.

Sinuses/Orbits: Complete opacification of the left frontal sinus,
left ethmoid air cells, left maxillary sinus, and left sphenoid
sinus with marked mucosal thickening in these areas. Mild mucosal
thickening of the right ethmoid air cells and right sphenoid sinus.
Areas of internal T2 hypointensity within the left ethmoid air cells
and the left maxillary sinus.

Other: Enlarged bilateral lateral retropharyngeal nodes.

MRA HEAD FINDINGS

Anterior circulation: Bilateral ICAs, MCAs, and ACAs are patent
without proximal flow limiting stenosis. No aneurysm identified.

Posterior circulation: Small right intradural vertebral artery, most
likely non dominant. Bilateral intradural vertebral arteries,
basilar artery, and posterior cerebral arteries are patent without
proximal flow limiting stenosis. Prominent bilateral posterior
communicating arteries, anatomic variant. No aneurysm identified.

Anatomic variants: As detailed above.

MRV HEAD FINDINGS

No evidence of dural venous sinus thrombosis. The left transverse
sinus is hypoplastic. The visualized deep cerebral veins are patent.
Cavernous sinus enhancement appears symmetric.
IMPRESSION: MRI:

1. No evidence of acute intracranial abnormality.
2. Severe left ostiomeatal lesion pattern sinusitis, as described
above. Areas of internal T2 hypointensity could represent
inspissated secretions and/or fungal colonization.
3. Enlarged bilateral lateral retropharyngeal nodes, nonspecific but
potentially reactive.

MRA:

No large vessel occlusion or proximal hemodynamically significant
stenosis.

MRV:

No evidence of dural venous sinus thrombosis.

## 2020-12-04 IMAGING — MR MR HEAD W/O CM
15 of 17 series · 44 of 48 positions shown · IV contrast (gadavist)
Comparison: None.
COMPARISON: None.

Addendum:
CLINICAL DATA: Headache.  Papilledema.

EXAM:
MRI HEAD WITHOUT CONTRAST
MRA HEAD WITHOUT AND WITH CONTRAST
MRV HEAD WITHOUT AND WITH CONTRAST
TECHNIQUE: Multiplanar, multi-echo pulse sequences of the brain and surrounding
structures were acquired without intravenous contrast. Angiographic
images of the Circle of Willis were acquired using MRA technique
without and with intravenous contrast. Angiographic images of the
intracranial venous structures were acquired using MRV technique
without and with intravenous contrast.
CONTRAST:  7.5mL GADAVIST GADOBUTROL 1 MMOL/ML IV SOLN

[Series 5: ax dwi_tracew · axial · 3.0mm · 0.65mm/px · z∈[-100,+61]mm · 3 of 100 slices shown]
[im 1/100]
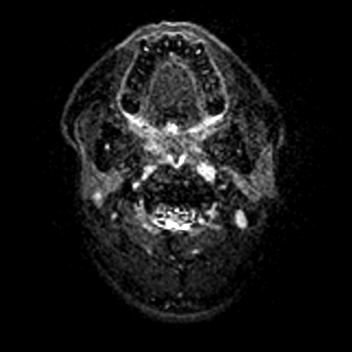
[im 50/100]
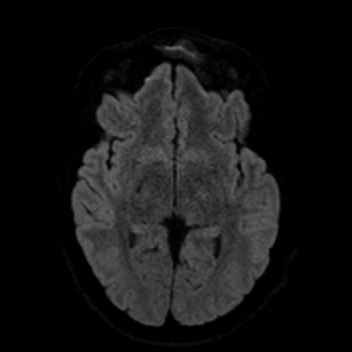
[im 100/100]
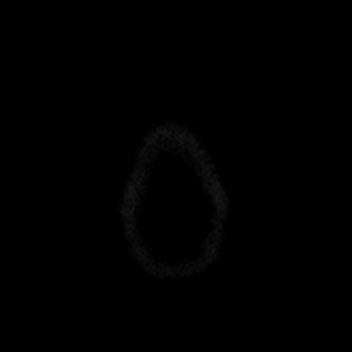

[Series 6: ax dwi_adc · axial · 3.0mm · 0.65mm/px · z∈[-100,+61]mm · 2 of 50 slices shown]
[im 1/50]
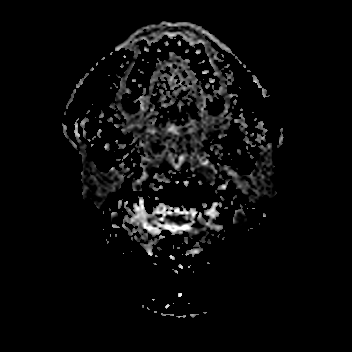
[im 50/50]
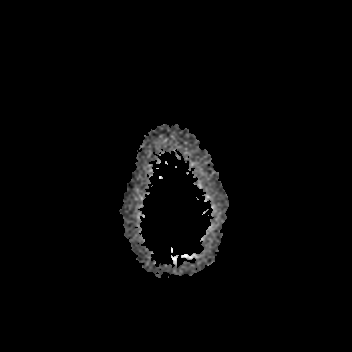

[Series 7: cor dwi_tracew · coronal · 5.0mm · 0.65mm/px · 3 of 80 slices shown]
[im 1/80]
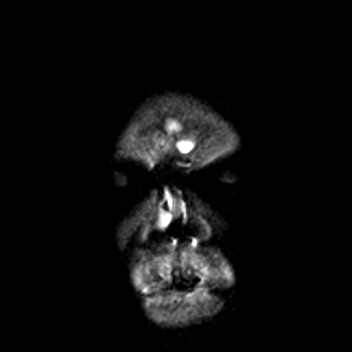
[im 40/80]
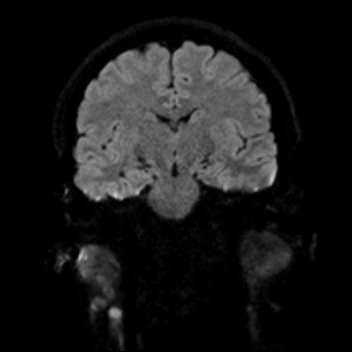
[im 80/80]
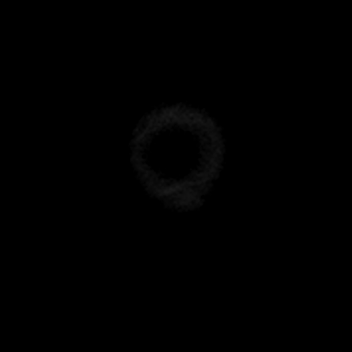

[Series 8: cor dwi_adc · coronal · 5.0mm · 0.65mm/px · 1 of 40 slices shown]
[im 1/40]
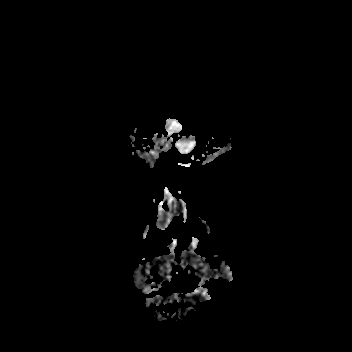

[Series 14: T1 · sagittal · 5.0mm · 0.62mm/px · 1 of 25 slices shown (1 of 2)]
[im 1/25]
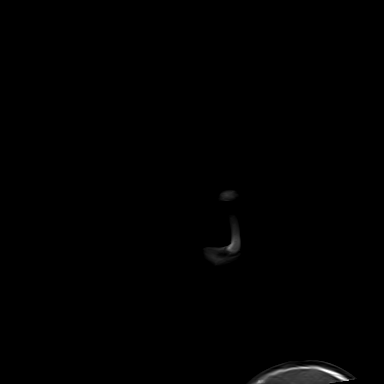

[Series 15: T2 · axial · 5.0mm · 0.53mm/px · 1 of 28 slices shown (1 of 2)]
[im 1/28]
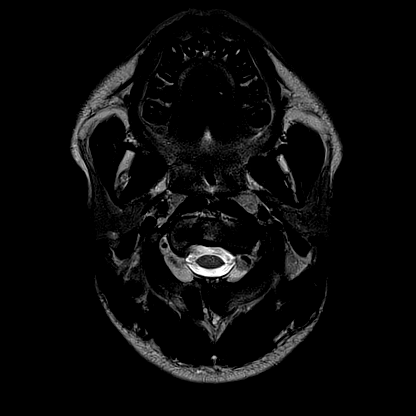

[Series 18: swi_images · axial · 3.0mm · 0.90mm/px · z∈[-109,+67]mm · 2 of 60 slices shown]
[im 1/60]
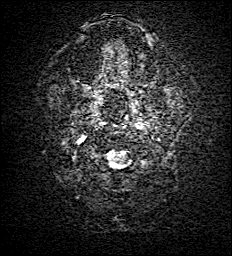
[im 60/60]
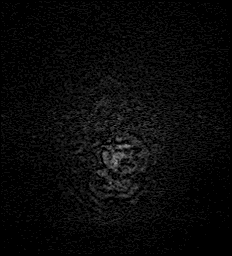

[Series 19: mip_images(sw) · axial · 24.0mm · 0.90mm/px · z∈[-98,+57]mm · 2 of 53 slices shown]
[im 1/53]
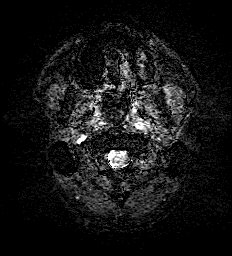
[im 53/53]
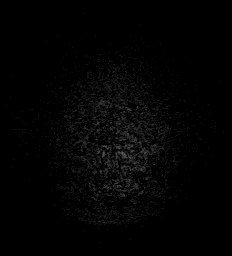

[Series 20: FLAIR · axial · 3.0mm · 0.53mm/px · z∈[-101,+60]mm · 2 of 55 slices shown]
[im 1/55]
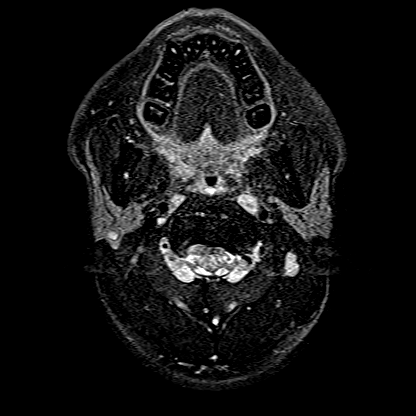
[im 55/55]
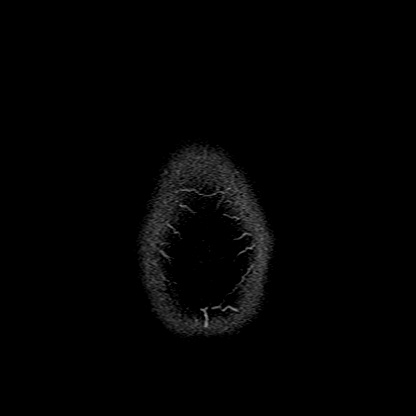

[Series 21: T1 · axial · 1.0mm · 0.98mm/px · z∈[-114,+75]mm · 6 of 192 slices shown (2 of 2)]
[im 1/192]
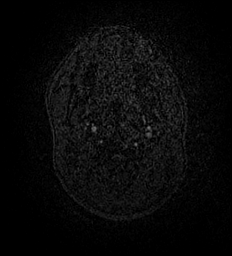
[im 39/192]
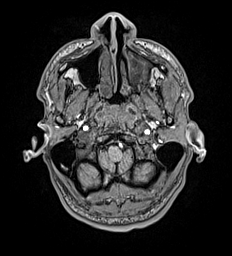
[im 77/192]
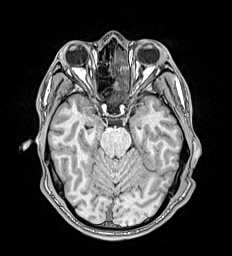
[im 115/192]
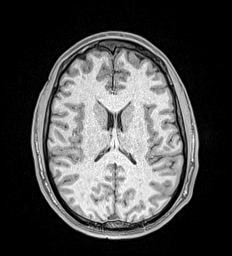
[im 153/192]
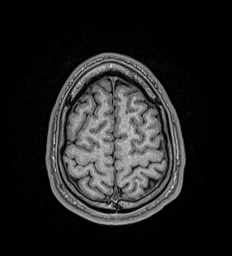
[im 192/192]
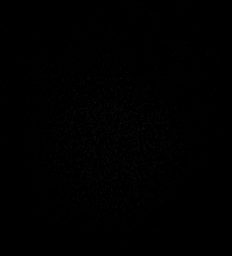

[Series 22: T2 · coronal · 5.0mm · 0.57mm/px · 1 of 35 slices shown (2 of 2)]
[im 1/35]
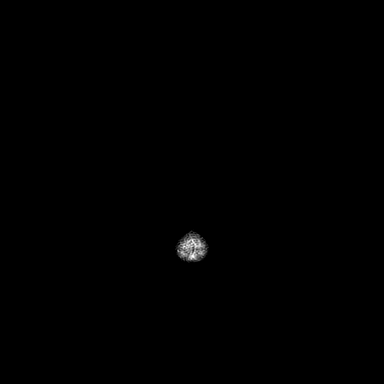

[Series 27: TOF · coronal · 2.5mm · 0.98mm/px · 4 of 128 slices shown (1 of 2)]
[im 1/128]
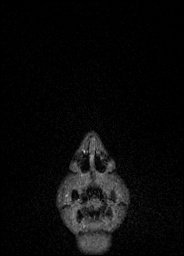
[im 43/128]
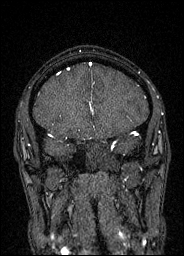
[im 85/128]
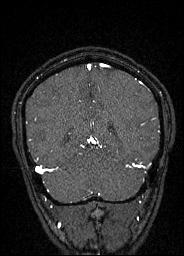
[im 128/128]
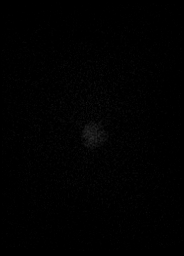

[Series 34: TOF · coronal · 2.5mm · 0.98mm/px · 4 of 128 slices shown (2 of 2)]
[im 1/128]
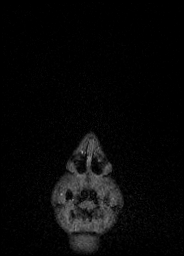
[im 43/128]
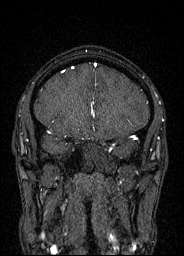
[im 85/128]
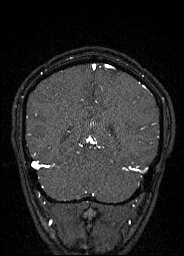
[im 128/128]
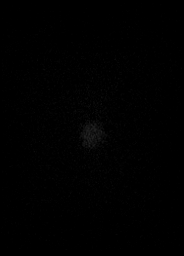

[Series 38: T1 post-contrast · sagittal · 1.0mm · 0.98mm/px · 6 of 191 slices shown (1 of 2)]
[im 1/191]
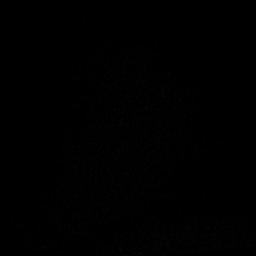
[im 39/191]
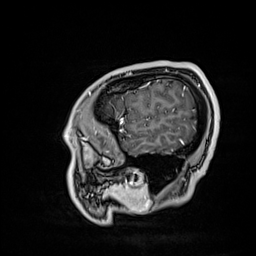
[im 77/191]
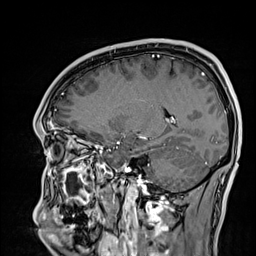
[im 115/191]
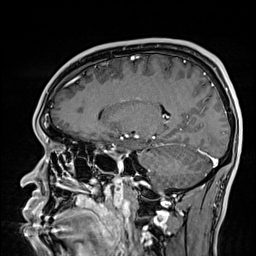
[im 153/191]
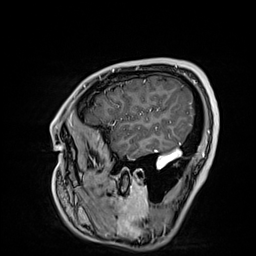
[im 191/191]
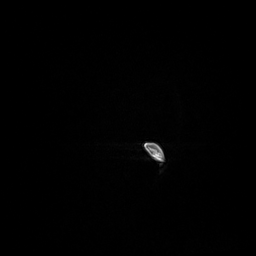

[Series 39: T1 post-contrast · sagittal · 1.0mm · 0.98mm/px · 6 of 192 slices shown (2 of 2)]
[im 1/192]
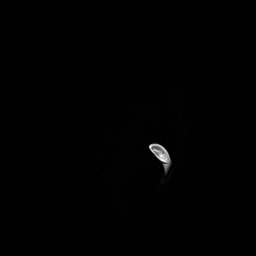
[im 39/192]
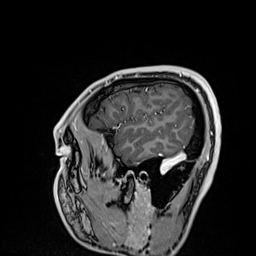
[im 77/192]
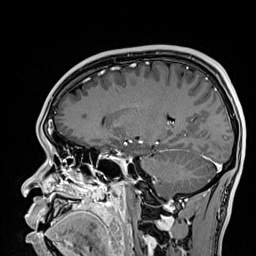
[im 115/192]
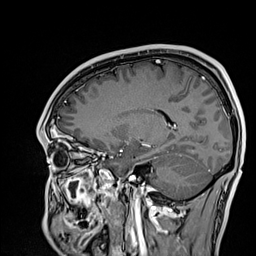
[im 153/192]
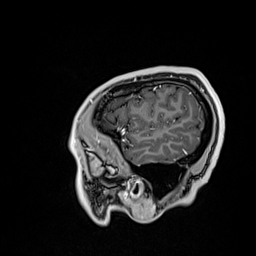
[im 192/192]
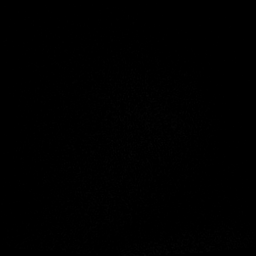

[44 of 48 positions shown; findings below may reference images not displayed]

FINDINGS: MRI HEAD FINDINGS

Brain: No acute infarction, hemorrhage, hydrocephalus, extra-axial
collection or mass lesion. A single punctate T2/FLAIR hyperintensity
is noted within the right frontal white matter, which is nonspecific
but within normal limits for patient age.

Vascular: See below.

Skull and upper cervical spine: Normal marrow signal.

Sinuses/Orbits: Complete opacification of the left frontal sinus,
left ethmoid air cells, left maxillary sinus, and left sphenoid
sinus with marked mucosal thickening in these areas. Mild mucosal
thickening of the right ethmoid air cells and right sphenoid sinus.
Areas of internal T2 hypointensity within the left ethmoid air cells
and the left maxillary sinus.

Other: Enlarged bilateral lateral retropharyngeal nodes.

MRA HEAD FINDINGS

Anterior circulation: Bilateral ICAs, MCAs, and ACAs are patent
without proximal flow limiting stenosis. No aneurysm identified.

Posterior circulation: Small right intradural vertebral artery, most
likely non dominant. Bilateral intradural vertebral arteries,
basilar artery, and posterior cerebral arteries are patent without
proximal flow limiting stenosis. Prominent bilateral posterior
communicating arteries, anatomic variant. No aneurysm identified.

Anatomic variants: As detailed above.

MRV HEAD FINDINGS

No evidence of dural venous sinus thrombosis. The left transverse
sinus is hypoplastic. The visualized deep cerebral veins are patent.
Cavernous sinus enhancement appears symmetric.
IMPRESSION: MRI:

1. No evidence of acute intracranial abnormality.
2. Severe left ostiomeatal lesion pattern sinusitis, as described
above. Areas of internal T2 hypointensity could represent
inspissated secretions and/or fungal colonization.
3. Enlarged bilateral lateral retropharyngeal nodes, nonspecific but
potentially reactive.

MRA:

No large vessel occlusion or proximal hemodynamically significant
stenosis.

MRV:

No evidence of dural venous sinus thrombosis.

ADDENDUM:
Correction to the impression: There is severe left ostiomeatal UNIT
pattern sinusitis, not left ostiomeatal lesion pattern.

Findings in the report and this addendum discussed with Dr.
EM BILAL Via telephone at [DATE].

*** End of Addendum ***
FINDINGS: MRI HEAD FINDINGS

Brain: No acute infarction, hemorrhage, hydrocephalus, extra-axial
collection or mass lesion. A single punctate T2/FLAIR hyperintensity
is noted within the right frontal white matter, which is nonspecific
but within normal limits for patient age.

Vascular: See below.

Skull and upper cervical spine: Normal marrow signal.

Sinuses/Orbits: Complete opacification of the left frontal sinus,
left ethmoid air cells, left maxillary sinus, and left sphenoid
sinus with marked mucosal thickening in these areas. Mild mucosal
thickening of the right ethmoid air cells and right sphenoid sinus.
Areas of internal T2 hypointensity within the left ethmoid air cells
and the left maxillary sinus.

Other: Enlarged bilateral lateral retropharyngeal nodes.

MRA HEAD FINDINGS

Anterior circulation: Bilateral ICAs, MCAs, and ACAs are patent
without proximal flow limiting stenosis. No aneurysm identified.

Posterior circulation: Small right intradural vertebral artery, most
likely non dominant. Bilateral intradural vertebral arteries,
basilar artery, and posterior cerebral arteries are patent without
proximal flow limiting stenosis. Prominent bilateral posterior
communicating arteries, anatomic variant. No aneurysm identified.

Anatomic variants: As detailed above.

MRV HEAD FINDINGS

No evidence of dural venous sinus thrombosis. The left transverse
sinus is hypoplastic. The visualized deep cerebral veins are patent.
Cavernous sinus enhancement appears symmetric.
IMPRESSION: MRI:

1. No evidence of acute intracranial abnormality.
2. Severe left ostiomeatal lesion pattern sinusitis, as described
above. Areas of internal T2 hypointensity could represent
inspissated secretions and/or fungal colonization.
3. Enlarged bilateral lateral retropharyngeal nodes, nonspecific but
potentially reactive.

MRA:

No large vessel occlusion or proximal hemodynamically significant
stenosis.

MRV:

No evidence of dural venous sinus thrombosis.

## 2020-12-04 MED ORDER — ACETAMINOPHEN 325 MG PO TABS
650.0000 mg | ORAL_TABLET | Freq: Once | ORAL | Status: AC
  Administered 2020-12-04: 650 mg via ORAL
  Filled 2020-12-04: qty 2

## 2020-12-04 MED ORDER — AMOXICILLIN-POT CLAVULANATE 875-125 MG PO TABS: 1.0000 | ORAL_TABLET | Freq: Two times a day (BID) | ORAL | 0 refills | Status: AC

## 2020-12-04 MED ORDER — GADOBUTROL 1 MMOL/ML IV SOLN
7.5000 mL | Freq: Once | INTRAVENOUS | Status: AC | PRN
  Administered 2020-12-04: 7.5 mL via INTRAVENOUS

## 2020-12-04 MED ORDER — OXYMETAZOLINE HCL 0.05 % NA SOLN
2.0000 | Freq: Once | NASAL | Status: AC
  Administered 2020-12-04: 2 via NASAL
  Filled 2020-12-04: qty 30

## 2020-12-04 MED ORDER — ONDANSETRON HCL 4 MG/2ML IJ SOLN
4.0000 mg | Freq: Once | INTRAMUSCULAR | Status: AC
  Administered 2020-12-04: 4 mg via INTRAVENOUS
  Filled 2020-12-04: qty 2

## 2020-12-04 MED ORDER — MORPHINE SULFATE (PF) 4 MG/ML IV SOLN
4.0000 mg | Freq: Once | INTRAVENOUS | Status: AC
Start: 2020-12-04 — End: 2020-12-04
  Administered 2020-12-04: 4 mg via INTRAVENOUS
  Filled 2020-12-04: qty 1

## 2020-12-04 MED ORDER — FENTANYL CITRATE (PF) 100 MCG/2ML IJ SOLN
50.0000 ug | Freq: Once | INTRAMUSCULAR | Status: AC
  Administered 2020-12-04: 50 ug via INTRAVENOUS
  Filled 2020-12-04: qty 2

## 2020-12-04 MED ORDER — PSEUDOEPHEDRINE HCL ER 120 MG PO TB12: 120.0000 mg | ORAL_TABLET | Freq: Two times a day (BID) | ORAL | 0 refills | Status: AC | PRN

## 2020-12-04 MED ORDER — OXYCODONE-ACETAMINOPHEN 5-325 MG PO TABS
1.0000 | ORAL_TABLET | Freq: Once | ORAL | Status: AC
  Administered 2020-12-04: 1 via ORAL
  Filled 2020-12-04: qty 1

## 2020-12-04 MED ORDER — AMOXICILLIN-POT CLAVULANATE 875-125 MG PO TABS
1.0000 | ORAL_TABLET | Freq: Once | ORAL | Status: AC
  Administered 2020-12-04: 1 via ORAL
  Filled 2020-12-04: qty 1

## 2020-12-04 MED ORDER — PSEUDOEPHEDRINE HCL ER 120 MG PO TB12
120.0000 mg | ORAL_TABLET | Freq: Two times a day (BID) | ORAL | Status: DC
  Administered 2020-12-04: 120 mg via ORAL
  Filled 2020-12-04 (×2): qty 1

## 2020-12-04 NOTE — Discharge Instructions (Addendum)
Please call the number to follow-up with ENT regarding your sinus infection.  Please take your antibiotics as prescribed for their entire course.  You may use Afrin every 8 hours as needed for congestion for the first 3 days.  As we discussed please stop use of Afrin by 12/07/2020.  You may use pseudoephedrine as prescribed for congestion as well.  Return to the emergency department for any significant return of facial pain, any visual changes or development of fever.

## 2020-12-04 NOTE — ED Notes (Signed)
Patient Alert and oriented to baseline. Stable and ambulatory to baseline. Patient verbalized understanding of the discharge instructions.  Patient belongings were taken by the patient.   

## 2020-12-04 NOTE — ED Provider Notes (Signed)
[facility] Emergency Department Provider Note  ____________________________________________  Time seen: Approximately 3:11 AM  I have reviewed the triage vital signs and the nursing notes.   HISTORY  Chief Complaint Headache   HPI Hunter Wong is a 25 y.o. male no significant past medical history who presents for evaluation of left-sided headache and eye pain.  Patient reports that his symptoms started 3 days ago initially with a headache.  Describes a progressively worsening throbbing left-sided headache.  He feels the pain is behind his left eye.  He then started having pain and redness of the left eye as well.  Denies any blurry vision or loss of vision, denies any trauma.  Patient has been taking Tylenol at home with no significant relief.  Reports that his eye has been watering a lot.  He denies congestion or sinus pressure, cough, sore throat, fever or chills, nausea or vomiting, body aches.  He denies ever having similar pain.  History reviewed. No pertinent past medical history.  There are no problems to display for this patient.   History reviewed. No pertinent surgical history.  Prior to Admission medications   Medication Sig Start Date End Date Taking? Authorizing Provider  doxycycline (VIBRAMYCIN) 100 MG capsule Take 1 capsule (100 mg total) by mouth 2 (two) times daily. Patient not taking: Reported on 09/01/2019 08/30/19   Tommie Sams, DO  ketorolac (TORADOL) 10 MG tablet Take 1 tablet (10 mg total) by mouth every 6 (six) hours as needed for moderate pain or severe pain. Patient not taking: Reported on 09/01/2019 08/30/19   Tommie Sams, DO    Allergies Patient has no known allergies.  No family history on file.  Social History Social History   Tobacco Use   Smoking status: Former   Smokeless tobacco: Never  Substance Use Topics   Alcohol use: No   Drug use: Never    Review of Systems  Constitutional: Negative for fever. Eyes:  Negative for visual changes. + L eye pain and watering ENT: Negative for sore throat. Neck: No neck pain  Cardiovascular: Negative for chest pain. Respiratory: Negative for shortness of breath. Gastrointestinal: Negative for abdominal pain, vomiting or diarrhea. Genitourinary: Negative for dysuria. Musculoskeletal: Negative for back pain. Skin: Negative for rash. Neurological: Negative for weakness or numbness. + HA Psych: No SI or HI  ____________________________________________   PHYSICAL EXAM:  VITAL SIGNS: ED Triage Vitals  Enc Vitals Group     BP 12/03/20 2235 (!) 141/95     Pulse Rate 12/03/20 2235 71     Resp 12/03/20 2235 16     Temp 12/03/20 2235 98.1 F (36.7 C)     Temp Source 12/03/20 2235 Oral     SpO2 12/03/20 2235 98 %     Weight --      Height --      Head Circumference --      Peak Flow --      Pain Score 12/03/20 2236 10     Pain Loc --      Pain Edu? --      Excl. in GC? --     Constitutional: Alert and oriented. Well appearing and in no apparent distress. HEENT:      Head: Normocephalic and atraumatic.    EYE EXAM: EOMI and not painful. PERRL bilaterally. Intact consensual light reflex. L sided photophobia. L sided injected conjunctiva and tearing. Sclerae in anicteric. Eye lid everted and no foreign objects or stye observed.  Visual fields are intact. Visual acuity is 20/20 on the L and 20/25 on the R. No stye or chalazion. Ocular pressure is normal (16, 16, 19). No fluorescin uptake observed with Wood's lamp. No blepharitis. No erythema surrounding the eye but mild L eyelid swelling      Mouth/Throat: Mucous membranes are moist.       Neck: Supple with no signs of meningismus. Cardiovascular: Regular rate and rhythm. No murmurs, gallops, or rubs.  Respiratory: Normal respiratory effort. Lungs are clear to auscultation bilaterally.  Gastrointestinal: Soft, non tender. Musculoskeletal:  No edema, cyanosis, or erythema of extremities. Neurologic:  Normal speech and language. Face is symmetric.  Intact strength and sensation x4, no dysmetria or pronator drift, normal gait skin: Skin is warm, dry and intact. No rash noted. Psychiatric: Mood and affect are normal. Speech and behavior are normal.  ____________________________________________   LABS (all labs ordered are listed, but only abnormal results are displayed)  Labs Reviewed - No data to display ____________________________________________  EKG  none  ____________________________________________  RADIOLOGY  MRI/MRA/MRV: PND  ___________________________________________   PROCEDURES  Procedure(s) performed: None Procedures Critical Care performed:  None ____________________________________________   INITIAL IMPRESSION / ASSESSMENT AND PLAN / ED COURSE  25 y.o. male no significant past medical history who presents for evaluation of left-sided headache and L-eye pain x 3 days.    Ddx acute angle closure glaucoma, sinus thrombosis, sinus infection, corneal abrasion  Patient initially with significant photophobia, conjunctiva injection of the left eye which improved after tetracaine drops.  Normal IOP, normal painless extraocular movements, pupils are equal round and reactive, positive consensual light reflex, no surrounding erythema but mild left eyelid swelling, normal visual fields, normal visual acuity.  MRI/MRA/MRV pending. Patient given IV fentanyl for pain  _________________________ 6:56 AM on 12/04/2020 ----------------------------------------- MRIs have been done but not read yet.  Patient continues to complain of 10 out of 10 pain after IV morphine and fentanyl.  Will give p.o. Tylenol and oxycodone.  Discussed with Dr. Inez Pilgrim from ophthalmology and instructed patient to go straight to his office this morning if the MRI is negative for a formal eye evaluation.  Instructed the patient to return to the emergency room if he is unable to be seen by the  ophthalmologist, if he has worsening pain, changes in vision or visual loss.    _____________________________________________ Please note:  Patient was evaluated in Emergency Department today for the symptoms described in the history of present illness. Patient was evaluated in the context of the global COVID-19 pandemic, which necessitated consideration that the patient might be at risk for infection with the SARS-CoV-2 virus that causes COVID-19. Institutional protocols and algorithms that pertain to the evaluation of patients at risk for COVID-19 are in a state of rapid change based on information released by regulatory bodies including the CDC and federal and state organizations. These policies and algorithms were followed during the patient's care in the ED.  Some ED evaluations and interventions may be delayed as a result of limited staffing during the pandemic.   [place] Controlled Substance Database was reviewed by me. ____________________________________________   FINAL CLINICAL IMPRESSION(S) / ED DIAGNOSES   Final diagnoses:  Acute nonintractable headache, unspecified headache type  Acute left eye pain      NEW MEDICATIONS STARTED DURING THIS VISIT:  ED Discharge Orders     None        Note:  This document was prepared using Dragon voice recognition software and may include unintentional  dictation errors.    Don Perking, [place], MD 12/04/20 [phone]

## 2020-12-04 NOTE — ED Provider Notes (Signed)
-----------------------------------------   10:12 AM on 12/04/2020 ----------------------------------------- Patient MRIs have resulted showing significant sinusitis.  Radiologist discussed this over the phone with me, no lesions or other concerning findings noted but does have significant sinusitis.  Given these findings this is likely the cause of the patient's discomfort and pain.  I gave the patient Augmentin pseudoephedrine and Afrin in the emergency department he states he is feeling much better.  As he has no visual changes and he is feeling better from a pain perspective I believe the patient would be safe for discharge home.  Do not believe he requires ophthalmology follow-up at this time however I have referred him to ENT for further evaluation.  I discussed return precautions for any visual changes any fever, patient agreeable to this plan.   Minna Antis, MD 12/04/20 1013

## 2020-12-04 NOTE — ED Notes (Signed)
EDT assisted pt to bathroom

## 2022-04-06 ENCOUNTER — Other Ambulatory Visit: Payer: Self-pay

## 2022-04-06 ENCOUNTER — Emergency Department: Payer: Medicaid Other

## 2022-04-06 ENCOUNTER — Emergency Department: Payer: Self-pay

## 2022-04-06 ENCOUNTER — Emergency Department
Admission: EM | Admit: 2022-04-06 | Discharge: 2022-04-06 | Disposition: A | Discharge status: Home / Self Care | Payer: Self-pay | Attending: Emergency Medicine | Admitting: Emergency Medicine

## 2022-04-06 ENCOUNTER — Encounter: Payer: Self-pay | Admitting: *Deleted

## 2022-04-06 DIAGNOSIS — Y92410 Unspecified street and highway as the place of occurrence of the external cause: Secondary | ICD-10-CM | POA: Insufficient documentation

## 2022-04-06 DIAGNOSIS — S62525A Nondisplaced fracture of distal phalanx of left thumb, initial encounter for closed fracture: Secondary | ICD-10-CM | POA: Insufficient documentation

## 2022-04-06 MED ORDER — IBUPROFEN 600 MG PO TABS: 600.0000 mg | ORAL_TABLET | Freq: Four times a day (QID) | ORAL | 0 refills | Status: DC | PRN

## 2022-04-06 MED ORDER — HYDROCODONE-ACETAMINOPHEN 5-325 MG PO TABS: 1.0000 | ORAL_TABLET | Freq: Four times a day (QID) | ORAL | 0 refills | Status: DC | PRN

## 2022-04-06 MED ORDER — IBUPROFEN 600 MG PO TABS: 600.0000 mg | ORAL_TABLET | Freq: Four times a day (QID) | ORAL | 0 refills | Status: AC | PRN

## 2022-04-06 NOTE — Discharge Instructions (Addendum)
Keep your splint on at all times until followed up with Orthopedics  Take Ibuprofen for mild pain and the Norco for severe pain  ELEVATE your hand/finger above your heart to help with swelling and pain  You can apply ICE for the first 24 hours
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
## 2023-02-26 ENCOUNTER — Emergency Department
Admission: EM | Admit: 2023-02-26 | Discharge: 2023-02-26 | Disposition: A | Discharge status: Home / Self Care | Payer: 59 | Attending: Emergency Medicine | Admitting: Emergency Medicine

## 2023-02-26 ENCOUNTER — Other Ambulatory Visit: Payer: Self-pay

## 2023-02-26 DIAGNOSIS — Z23 Encounter for immunization: Secondary | ICD-10-CM | POA: Diagnosis not present

## 2023-02-26 DIAGNOSIS — Y9301 Activity, walking, marching and hiking: Secondary | ICD-10-CM | POA: Diagnosis not present

## 2023-02-26 DIAGNOSIS — S8991XA Unspecified injury of right lower leg, initial encounter: Secondary | ICD-10-CM | POA: Diagnosis present

## 2023-02-26 DIAGNOSIS — S81811A Laceration without foreign body, right lower leg, initial encounter: Secondary | ICD-10-CM | POA: Diagnosis not present

## 2023-02-26 DIAGNOSIS — W25XXXA Contact with sharp glass, initial encounter: Secondary | ICD-10-CM | POA: Diagnosis not present

## 2023-02-26 MED ORDER — ACETAMINOPHEN 325 MG PO TABS
650.0000 mg | ORAL_TABLET | Freq: Once | ORAL | Status: AC
  Administered 2023-02-26: 650 mg via ORAL
  Filled 2023-02-26: qty 2

## 2023-02-26 MED ORDER — LIDOCAINE HCL (PF) 1 % IJ SOLN
5.0000 mL | Freq: Once | INTRAMUSCULAR | Status: AC
  Administered 2023-02-26: 5 mL
  Filled 2023-02-26: qty 5

## 2023-02-26 MED ORDER — TETANUS-DIPHTH-ACELL PERTUSSIS 5-2.5-18.5 LF-MCG/0.5 IM SUSY
0.5000 mL | PREFILLED_SYRINGE | Freq: Once | INTRAMUSCULAR | Status: AC
  Administered 2023-02-26: 0.5 mL via INTRAMUSCULAR
  Filled 2023-02-26: qty 0.5
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
## 2023-02-26 NOTE — Discharge Instructions (Signed)
You were evaluated in the emergency department for a laceration. It was repaired with sutures. Keep the area clean and dry.  Wash multiple times per day with soap and water.  Do not go into the ocean or swimming pool.  Return to the emergency department or your primary care physician's office in 7 days for suture removal.  Return to the emergency department for:  -- Fever > 100.4F -- Increase pain in the wound -- Increase redness and swelling -- Pus coming from the wound -- Wound bleeds more than a small amount or it does not stop -- Wound edges come apart -- Severe pain -- Weakness or numbness in the affected area  Or any other new or worsening symptoms. It was a pleasure caring for you.  
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
## 2023-03-03 ENCOUNTER — Other Ambulatory Visit: Payer: Self-pay

## 2023-03-03 ENCOUNTER — Emergency Department: Payer: 59

## 2023-03-03 ENCOUNTER — Encounter: Payer: Self-pay | Admitting: *Deleted

## 2023-03-03 DIAGNOSIS — Z4800 Encounter for change or removal of nonsurgical wound dressing: Secondary | ICD-10-CM | POA: Insufficient documentation

## 2023-03-03 DIAGNOSIS — M79661 Pain in right lower leg: Secondary | ICD-10-CM | POA: Insufficient documentation
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
## 2023-03-04 ENCOUNTER — Emergency Department: Payer: 59

## 2023-03-04 ENCOUNTER — Emergency Department
Admission: EM | Admit: 2023-03-04 | Discharge: 2023-03-04 | Disposition: A | Discharge status: Home / Self Care | Payer: 59 | Attending: Emergency Medicine | Admitting: Emergency Medicine

## 2023-03-04 DIAGNOSIS — M79661 Pain in right lower leg: Secondary | ICD-10-CM | POA: Diagnosis not present

## 2023-03-04 DIAGNOSIS — Z5189 Encounter for other specified aftercare: Secondary | ICD-10-CM

## 2023-03-04 LAB — CBC WITH DIFFERENTIAL/PLATELET
Abs Immature Granulocytes: 0.02 10*3/uL (ref 0.00–0.07)
Basophils Absolute: 0 10*3/uL (ref 0.0–0.1)
Basophils Relative: 1 %
Eosinophils Absolute: 0.2 10*3/uL (ref 0.0–0.5)
Eosinophils Relative: 4 %
HCT: 41.2 % (ref 39.0–52.0)
Hemoglobin: 13.9 g/dL (ref 13.0–17.0)
Immature Granulocytes: 0 %
Lymphocytes Relative: 37 %
Lymphs Abs: 2.4 10*3/uL (ref 0.7–4.0)
MCH: 29 pg (ref 26.0–34.0)
MCHC: 33.7 g/dL (ref 30.0–36.0)
MCV: 86 fL (ref 80.0–100.0)
Monocytes Absolute: 0.5 10*3/uL (ref 0.1–1.0)
Monocytes Relative: 8 %
Neutro Abs: 3.3 10*3/uL (ref 1.7–7.7)
Neutrophils Relative %: 50 %
Platelets: 193 10*3/uL (ref 150–400)
RBC: 4.79 MIL/uL (ref 4.22–5.81)
RDW: 11.8 % (ref 11.5–15.5)
WBC: 6.4 10*3/uL (ref 4.0–10.5)
nRBC: 0 % (ref 0.0–0.2)

## 2023-03-04 LAB — BASIC METABOLIC PANEL
Anion gap: 12 (ref 5–15)
BUN: 13 mg/dL (ref 6–20)
CO2: 27 mmol/L (ref 22–32)
Calcium: 9.7 mg/dL (ref 8.9–10.3)
Chloride: 102 mmol/L (ref 98–111)
Creatinine, Ser: 0.89 mg/dL (ref 0.61–1.24)
GFR, Estimated: >60 mL/min (ref >60)
Glucose, Bld: 103 mg/dL — ABNORMAL HIGH (ref 70–99)
Potassium: 4.2 mmol/L (ref 3.5–5.1)
Sodium: 141 mmol/L (ref 135–145)

## 2023-03-04 MED ORDER — SULFAMETHOXAZOLE-TRIMETHOPRIM 800-160 MG PO TABS
1.0000 | ORAL_TABLET | Freq: Once | ORAL | Status: AC
  Administered 2023-03-04: 1 via ORAL
  Filled 2023-03-04: qty 1

## 2023-03-04 MED ORDER — IOHEXOL 300 MG/ML  SOLN
100.0000 mL | Freq: Once | INTRAMUSCULAR | Status: AC | PRN
  Administered 2023-03-04: 100 mL via INTRAVENOUS

## 2023-03-04 MED ORDER — KETOROLAC TROMETHAMINE 15 MG/ML IJ SOLN
15.0000 mg | Freq: Once | INTRAMUSCULAR | Status: AC
  Administered 2023-03-04: 15 mg via INTRAVENOUS
  Filled 2023-03-04: qty 1

## 2023-03-04 MED ORDER — ACETAMINOPHEN 500 MG PO TABS
1000.0000 mg | ORAL_TABLET | Freq: Once | ORAL | Status: AC
  Administered 2023-03-04: 1000 mg via ORAL
  Filled 2023-03-04: qty 2

## 2023-03-04 MED ORDER — OXYCODONE HCL 5 MG PO TABS
5.0000 mg | ORAL_TABLET | Freq: Three times a day (TID) | ORAL | 0 refills | Status: AC | PRN
Start: 2023-03-04 — End: ?

## 2023-03-04 MED ORDER — OXYCODONE HCL 5 MG PO TABS
5.0000 mg | ORAL_TABLET | Freq: Once | ORAL | Status: AC
  Administered 2023-03-04: 5 mg via ORAL
  Filled 2023-03-04: qty 1

## 2023-03-04 MED ORDER — SULFAMETHOXAZOLE-TRIMETHOPRIM 800-160 MG PO TABS: 1.0000 | ORAL_TABLET | Freq: Two times a day (BID) | ORAL | 0 refills | Status: AC
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
## 2023-03-04 NOTE — ED Notes (Signed)
Pt. Update on POC and that we are waiting for CT. Results.
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
## 2023-03-04 NOTE — ED Notes (Signed)
Pt A&O x4, no obvious distress noted, respirations regular/unlabored. Pt verbalizes understanding of discharge instructions. Pt able to ambulate from ED independently.   

## 2023-03-04 NOTE — ED Notes (Signed)
Pt wheeled to restroom and back to bed safely.

## 2023-03-08 ENCOUNTER — Emergency Department: Payer: 59

## 2023-03-08 ENCOUNTER — Emergency Department
Admission: EM | Admit: 2023-03-08 | Discharge: 2023-03-08 | Disposition: A | Discharge status: Home / Self Care | Payer: 59 | Attending: Emergency Medicine | Admitting: Emergency Medicine

## 2023-03-08 ENCOUNTER — Other Ambulatory Visit: Payer: Self-pay

## 2023-03-08 DIAGNOSIS — I82411 Acute embolism and thrombosis of right femoral vein: Secondary | ICD-10-CM | POA: Diagnosis not present

## 2023-03-08 DIAGNOSIS — M79604 Pain in right leg: Secondary | ICD-10-CM | POA: Diagnosis present

## 2023-03-08 DIAGNOSIS — Z4802 Encounter for removal of sutures: Secondary | ICD-10-CM | POA: Insufficient documentation

## 2023-03-08 LAB — CBC
HCT: 38.8 % — ABNORMAL LOW (ref 39.0–52.0)
Hemoglobin: 13.2 g/dL (ref 13.0–17.0)
MCH: 29.1 pg (ref 26.0–34.0)
MCHC: 34 g/dL (ref 30.0–36.0)
MCV: 85.5 fL (ref 80.0–100.0)
Platelets: 214 10*3/uL (ref 150–400)
RBC: 4.54 MIL/uL (ref 4.22–5.81)
RDW: 11.9 % (ref 11.5–15.5)
WBC: 8.5 10*3/uL (ref 4.0–10.5)
nRBC: 0 % (ref 0.0–0.2)

## 2023-03-08 LAB — BASIC METABOLIC PANEL
Anion gap: 10 (ref 5–15)
BUN: 12 mg/dL (ref 6–20)
CO2: 25 mmol/L (ref 22–32)
Calcium: 9.4 mg/dL (ref 8.9–10.3)
Chloride: 103 mmol/L (ref 98–111)
Creatinine, Ser: 0.72 mg/dL (ref 0.61–1.24)
GFR, Estimated: >60 mL/min (ref >60)
Glucose, Bld: 102 mg/dL — ABNORMAL HIGH (ref 70–99)
Potassium: 4 mmol/L (ref 3.5–5.1)
Sodium: 138 mmol/L (ref 135–145)

## 2023-03-08 MED ORDER — APIXABAN (ELIQUIS) VTE STARTER PACK (10MG AND 5MG)
ORAL_TABLET | ORAL | 0 refills | Status: AC
  Filled 2023-03-08: qty 74, 30d supply, fill #0

## 2023-03-08 MED ORDER — ACETAMINOPHEN 500 MG PO TABS
1000.0000 mg | ORAL_TABLET | Freq: Once | ORAL | Status: AC
  Administered 2023-03-08: 1000 mg via ORAL
  Filled 2023-03-08: qty 2

## 2023-03-08 MED ORDER — KETOROLAC TROMETHAMINE 30 MG/ML IJ SOLN
30.0000 mg | Freq: Once | INTRAMUSCULAR | Status: AC
  Administered 2023-03-08: 30 mg via INTRAMUSCULAR
  Filled 2023-03-08: qty 1

## 2023-03-08 NOTE — Discharge Instructions (Addendum)
You are seen in the department for right leg pain.  You are diagnosed with a blood clot in your right leg.  You were started on a blood thinning medication called Eliquis.  You will take this medication for at least the next 3 months.  You are given a starter pack of Eliquis (apixaban), it is important that you get this medication filled and take your first dose today.  You are given information to follow-up with her primary care provider.  You are also given information to call vascular surgery, Dr. Gilda Crease for close follow-up, call them today.  Return to the emergency department if you have any chest pain, shortness of breath or worsening symptoms.  Eliquis is a blood thinner. if you have any trauma you will bleed more than normal, it is important that you are evaluated immediately for any trauma or signs of bleeding.  Pain control:  Acetaminophen (tylenol) - You can take 2 extra strength tablets (1000 mg) every 6 hours as needed for pain/fever.   Thank you for choosing [place] for your health care, it was my pleasure to care for you today!  Corena Herter, MD
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
## 2023-03-08 NOTE — ED Triage Notes (Signed)
Pt to ED for suture removal to right lower leg. Reports still having pain to area.

## 2023-03-10 ENCOUNTER — Telehealth (INDEPENDENT_AMBULATORY_CARE_PROVIDER_SITE_OTHER): Payer: Self-pay

## 2023-03-10 NOTE — Telephone Encounter (Signed)
LVM for pt TCB and schedule appt  NP. consult. acute PE + Call Schnier, Latina Craver, MD (Vascular Surgery) on 03/08/2023; vascular surgery for DVT (deep venous clot) follow up
